# Patient Record
Sex: Female | Born: 1950 | Race: Black or African American | Hispanic: No | Marital: Single | Smoking: Never smoker
Health system: Southern US, Community
[De-identification: ages and names within clinical notes are randomized; demographics above are authoritative.]

## PROBLEM LIST (undated history)

## (undated) HISTORY — PX: TRIGGER FINGER RELEASE: SHX641

---

## 2006-10-10 ENCOUNTER — Emergency Department: Payer: Self-pay | Admitting: Emergency Medicine

## 2007-11-22 ENCOUNTER — Emergency Department: Payer: Self-pay | Admitting: Unknown Physician Specialty

## 2009-10-10 ENCOUNTER — Other Ambulatory Visit: Payer: Self-pay | Admitting: General Practice

## 2010-01-28 ENCOUNTER — Emergency Department: Payer: Self-pay | Admitting: Emergency Medicine

## 2012-12-01 ENCOUNTER — Emergency Department: Payer: Self-pay | Admitting: Emergency Medicine

## 2012-12-01 LAB — BASIC METABOLIC PANEL
Anion Gap: 7 (ref 7–16)
BUN: 14 mg/dL (ref 7–18)
Calcium, Total: 9.1 mg/dL (ref 8.5–10.1)
Chloride: 108 mmol/L — ABNORMAL HIGH (ref 98–107)
Creatinine: 0.87 mg/dL (ref 0.60–1.30)
EGFR (African American): >60
EGFR (Non-African Amer.): >60
Glucose: 97 mg/dL (ref 65–99)
Sodium: 140 mmol/L (ref 136–145)

## 2012-12-01 LAB — CBC
HCT: 40.5 % (ref 35.0–47.0)
HGB: 13.5 g/dL (ref 12.0–16.0)
MCH: 29 pg (ref 26.0–34.0)
MCV: 87 fL (ref 80–100)
Platelet: 228 10*3/uL (ref 150–440)
RBC: 4.64 10*6/uL (ref 3.80–5.20)
RDW: 14.3 % (ref 11.5–14.5)
WBC: 7.6 10*3/uL (ref 3.6–11.0)

## 2012-12-03 ENCOUNTER — Emergency Department: Payer: Self-pay | Admitting: Emergency Medicine

## 2012-12-03 LAB — COMPREHENSIVE METABOLIC PANEL
BUN: 15 mg/dL (ref 7–18)
Calcium, Total: 9.7 mg/dL (ref 8.5–10.1)
Co2: 29 mmol/L (ref 21–32)
Creatinine: 1.09 mg/dL (ref 0.60–1.30)
EGFR (African American): >60
SGOT(AST): 25 U/L (ref 15–37)
SGPT (ALT): 21 U/L (ref 12–78)
Sodium: 135 mmol/L — ABNORMAL LOW (ref 136–145)
Total Protein: 8.7 g/dL — ABNORMAL HIGH (ref 6.4–8.2)

## 2012-12-03 LAB — URINALYSIS, COMPLETE
Bilirubin,UR: NEGATIVE
Glucose,UR: NEGATIVE mg/dL (ref 0–75)
Ketone: NEGATIVE
Ph: 5 (ref 4.5–8.0)
Protein: NEGATIVE
Specific Gravity: 1.006 (ref 1.003–1.030)

## 2012-12-03 LAB — CBC
HCT: 45.6 % (ref 35.0–47.0)
MCH: 29.2 pg (ref 26.0–34.0)
MCV: 87 fL (ref 80–100)
RBC: 5.25 10*6/uL — ABNORMAL HIGH (ref 3.80–5.20)
RDW: 14.4 % (ref 11.5–14.5)

## 2013-11-07 IMAGING — CT CT HEAD WITHOUT CONTRAST
1 of 2 series · 15 of 30 positions shown, 19 images · non-contrast
Comparison: none

REASON FOR EXAM: headache, vomiting
COMMENTS:

PROCEDURE:     CT  - CT HEAD WITHOUT CONTRAST  - December 03, 2012 [DATE]
RESULT:     History: Headache and vomiting.
Comparison Study: Prior CT of 01/28/2010.

[Series 2: soft tissue · axial · 0.42mm/px · z∈[-100,+36]mm · 15 of 31 slices shown, 19 images]
[im 2/31  brain]
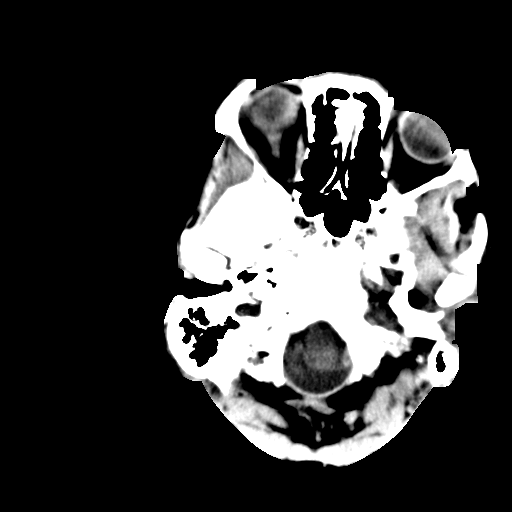
[im 2/31  bone]
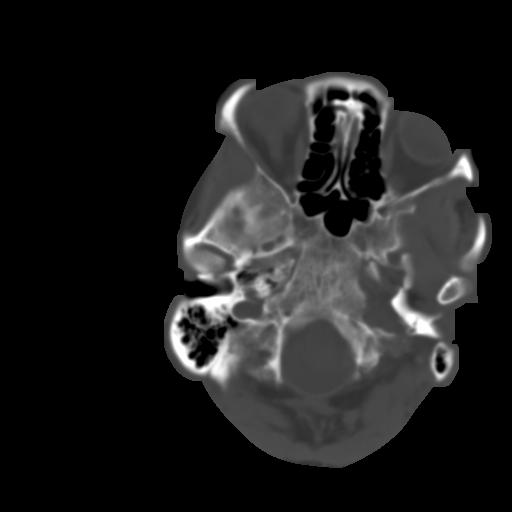
[im 4/31  brain]
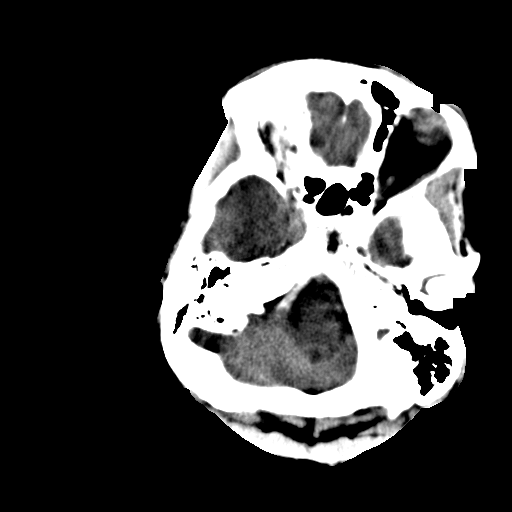
[im 6/31  brain]
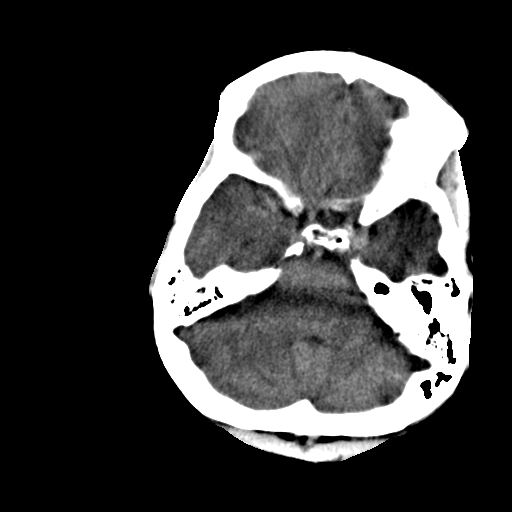
[im 8/31  brain]
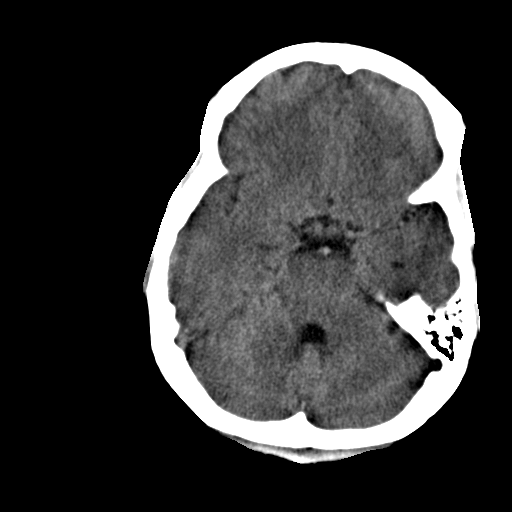
[im 10/31  brain]
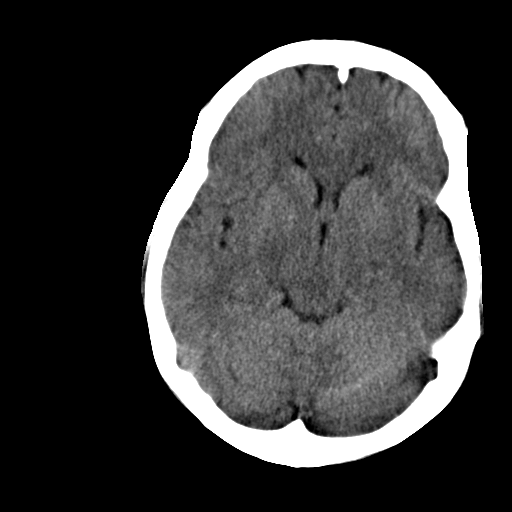
[im 10/31  bone]
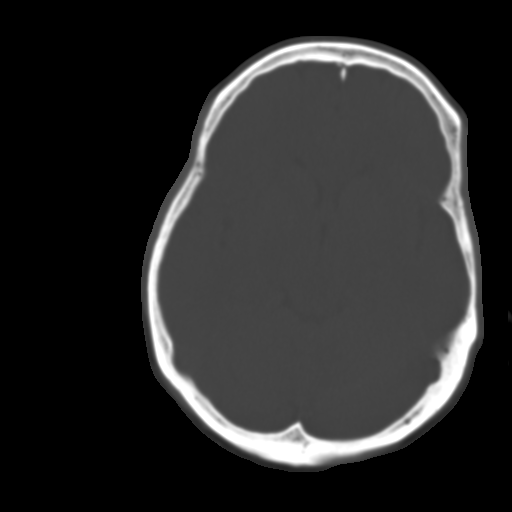
[im 12/31  brain]
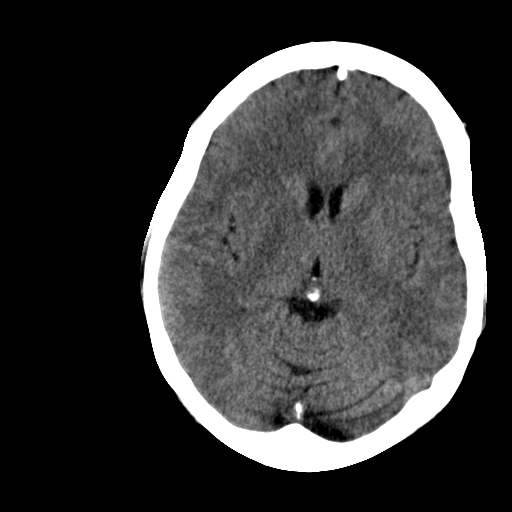
[im 14/31  brain]
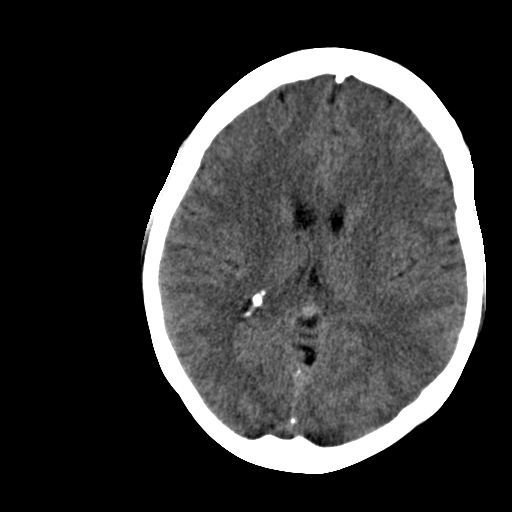
[im 16/31  brain]
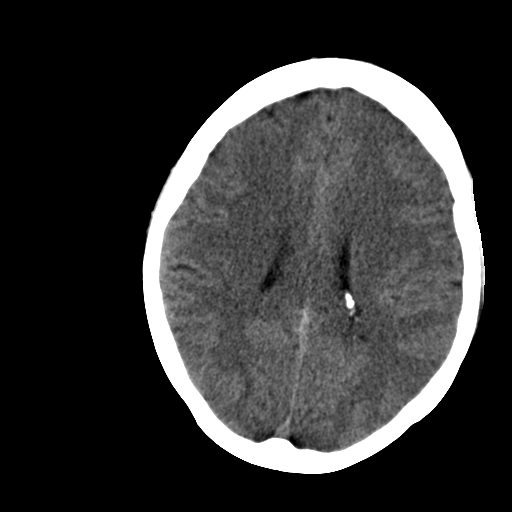
[im 17/31  brain]
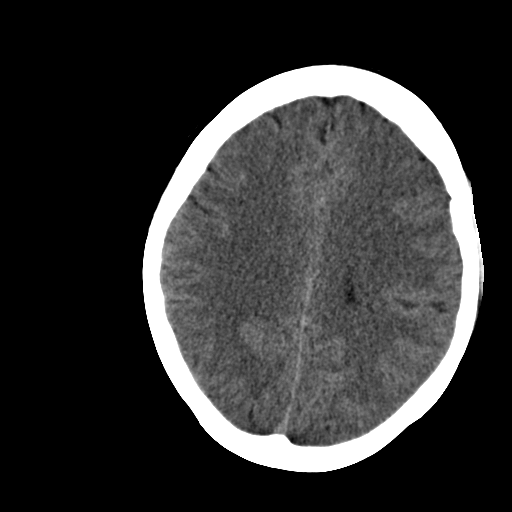
[im 17/31  bone]
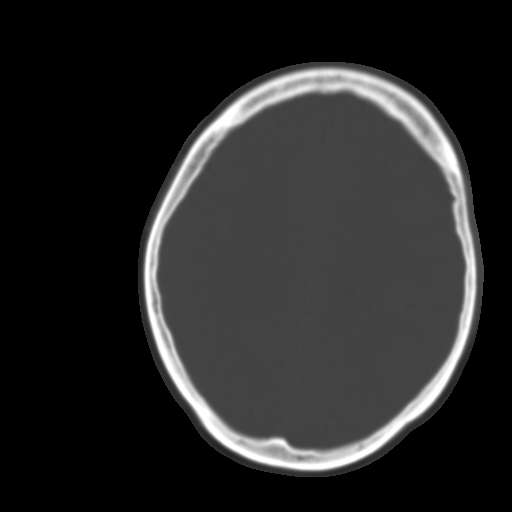
[im 19/31  brain]
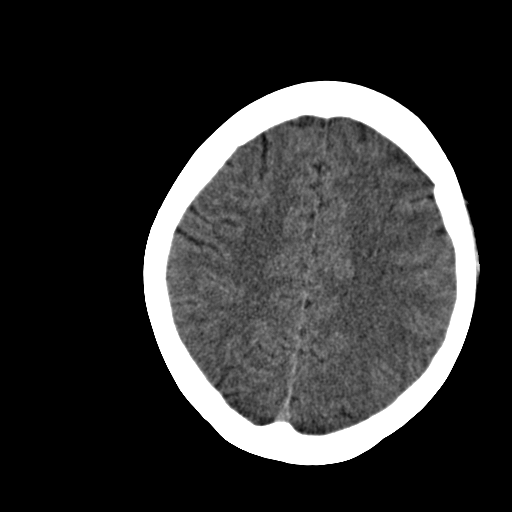
[im 21/31  brain]
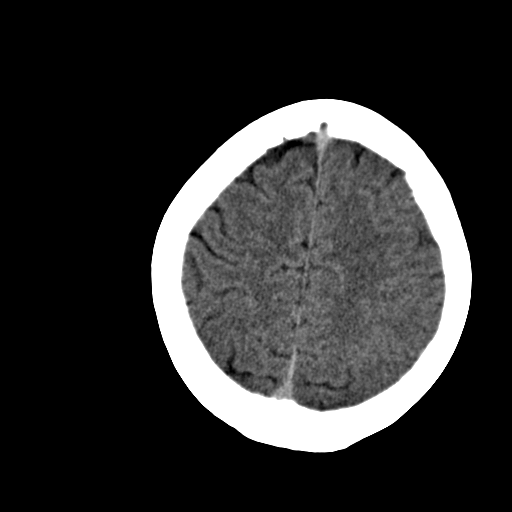
[im 23/31  brain]
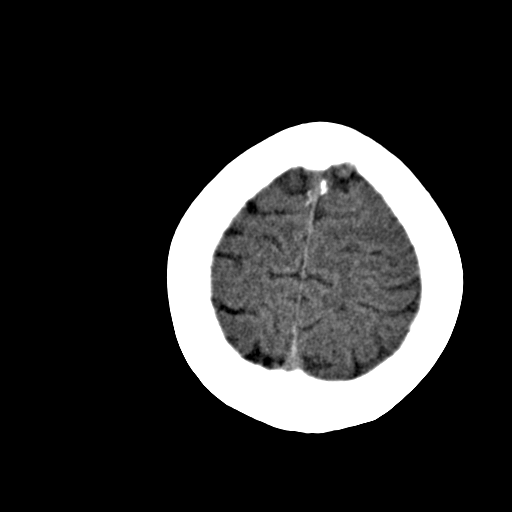
[im 25/31  brain]
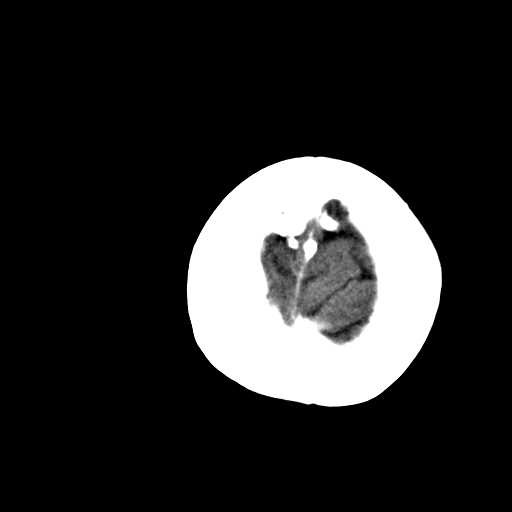
[im 25/31  bone]
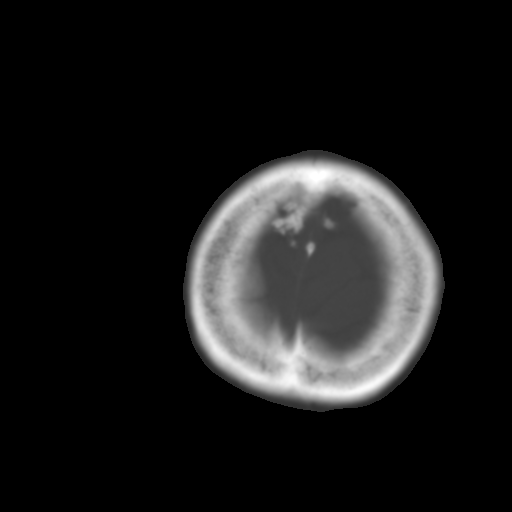
[im 27/31  brain]
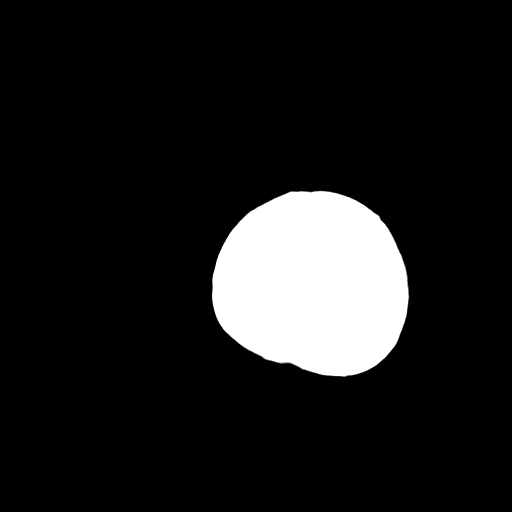
[im 29/31  brain]
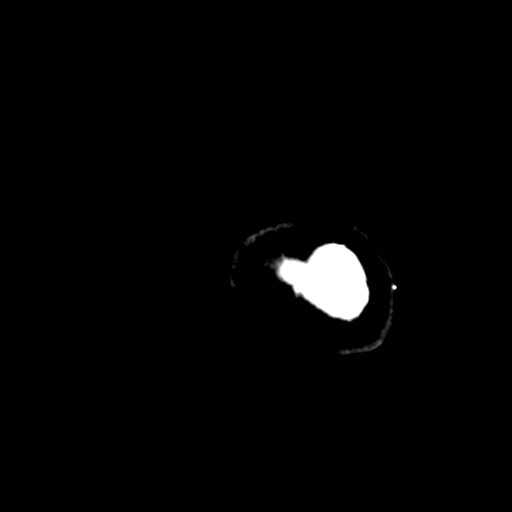

[15 of 30 positions shown; findings below may reference images not displayed]

FINDINGS: Standard exam performed . No mass. No hydrocephalus. No
hemorrhage. No acute bony abnormality.
IMPRESSION: No acute abnormality.

## 2014-02-08 ENCOUNTER — Emergency Department: Payer: Self-pay | Admitting: Emergency Medicine

## 2014-02-08 LAB — COMPREHENSIVE METABOLIC PANEL
ALT: 18 U/L (ref 12–78)
AST: 22 U/L (ref 15–37)
Albumin: 3.9 g/dL (ref 3.4–5.0)
Alkaline Phosphatase: 76 U/L
Anion Gap: 7 (ref 7–16)
BILIRUBIN TOTAL: 0.5 mg/dL (ref 0.2–1.0)
BUN: 6 mg/dL — ABNORMAL LOW (ref 7–18)
CALCIUM: 9 mg/dL (ref 8.5–10.1)
CREATININE: 0.91 mg/dL (ref 0.60–1.30)
Chloride: 106 mmol/L (ref 98–107)
Co2: 28 mmol/L (ref 21–32)
EGFR (African American): >60
EGFR (Non-African Amer.): >60
Glucose: 96 mg/dL (ref 65–99)
OSMOLALITY: 279 (ref 275–301)
Potassium: 3.1 mmol/L — ABNORMAL LOW (ref 3.5–5.1)
SODIUM: 141 mmol/L (ref 136–145)
TOTAL PROTEIN: 8.2 g/dL (ref 6.4–8.2)

## 2014-02-08 LAB — CBC WITH DIFFERENTIAL/PLATELET
Basophil #: 0 10*3/uL (ref 0.0–0.1)
Basophil %: 0.7 %
EOS ABS: 0.2 10*3/uL (ref 0.0–0.7)
Eosinophil %: 3.5 %
HCT: 44.1 % (ref 35.0–47.0)
HGB: 14.6 g/dL (ref 12.0–16.0)
LYMPHS PCT: 20.6 %
Lymphocyte #: 1.3 10*3/uL (ref 1.0–3.6)
MCH: 29.1 pg (ref 26.0–34.0)
MCHC: 33.1 g/dL (ref 32.0–36.0)
MCV: 88 fL (ref 80–100)
Monocyte #: 0.5 x10 3/mm (ref 0.2–0.9)
Monocyte %: 7.9 %
NEUTROS PCT: 67.3 %
Neutrophil #: 4.3 10*3/uL (ref 1.4–6.5)
Platelet: 308 10*3/uL (ref 150–440)
RBC: 5.02 10*6/uL (ref 3.80–5.20)
RDW: 14.1 % (ref 11.5–14.5)
WBC: 6.4 10*3/uL (ref 3.6–11.0)

## 2021-07-30 ENCOUNTER — Emergency Department: Payer: Medicare Other

## 2021-07-30 ENCOUNTER — Encounter: Payer: Self-pay | Admitting: Emergency Medicine

## 2021-07-30 ENCOUNTER — Other Ambulatory Visit: Payer: Self-pay

## 2021-07-30 ENCOUNTER — Emergency Department
Admission: EM | Admit: 2021-07-30 | Discharge: 2021-07-30 | Disposition: A | Discharge status: Home / Self Care | Payer: Medicare Other | Attending: Emergency Medicine | Admitting: Emergency Medicine

## 2021-07-30 DIAGNOSIS — H6501 Acute serous otitis media, right ear: Secondary | ICD-10-CM | POA: Insufficient documentation

## 2021-07-30 DIAGNOSIS — J181 Lobar pneumonia, unspecified organism: Secondary | ICD-10-CM | POA: Insufficient documentation

## 2021-07-30 DIAGNOSIS — J189 Pneumonia, unspecified organism: Secondary | ICD-10-CM

## 2021-07-30 DIAGNOSIS — H6500 Acute serous otitis media, unspecified ear: Secondary | ICD-10-CM

## 2021-07-30 DIAGNOSIS — H9201 Otalgia, right ear: Secondary | ICD-10-CM | POA: Diagnosis present

## 2021-07-30 DIAGNOSIS — Z20822 Contact with and (suspected) exposure to covid-19: Secondary | ICD-10-CM | POA: Diagnosis not present

## 2021-07-30 LAB — RESP PANEL BY RT-PCR (FLU A&B, COVID) ARPGX2
Influenza A by PCR: NEGATIVE
Influenza B by PCR: NEGATIVE
SARS Coronavirus 2 by RT PCR: NEGATIVE

## 2021-07-30 MED ORDER — AZITHROMYCIN 250 MG PO TABS: ORAL_TABLET | ORAL | 0 refills | Status: DC

## 2021-07-30 MED ORDER — AMOXICILLIN-POT CLAVULANATE 875-125 MG PO TABS
1.0000 | ORAL_TABLET | Freq: Once | ORAL | Status: AC
  Administered 2021-07-30: 11:00:00 1 via ORAL
  Filled 2021-07-30: qty 1

## 2021-07-30 MED ORDER — OXYCODONE-ACETAMINOPHEN 5-325 MG PO TABS
1.0000 | ORAL_TABLET | Freq: Four times a day (QID) | ORAL | 0 refills | Status: DC | PRN
Start: 2021-07-30 — End: 2021-09-19

## 2021-07-30 MED ORDER — AMOXICILLIN 875 MG PO TABS: 875.0000 mg | ORAL_TABLET | Freq: Two times a day (BID) | ORAL | 0 refills | Status: DC

## 2021-07-30 MED ORDER — OXYCODONE-ACETAMINOPHEN 5-325 MG PO TABS
1.0000 | ORAL_TABLET | Freq: Once | ORAL | Status: AC
Start: 2021-07-30 — End: 2021-07-30
  Administered 2021-07-30: 11:00:00 1 via ORAL
  Filled 2021-07-30: qty 1

## 2021-07-30 NOTE — Discharge Instructions (Signed)
Follow-up with your regular doctor if not improving in 3 to 4 days.  Return emergency department if worsening.  Take medications as prescribed.  Use over-the-counter Mucinex and Delsym for your cough.  With pneumonia you do not want to suppress the cough so much that you do not cough at all as this is how your lungs clear the infection.

## 2021-07-30 NOTE — ED Provider Notes (Signed)
[facility] Emergency Department Provider Note  ____________________________________________   Event Date/Time   First MD Initiated Contact with Patient 07/30/21 1007     (approximate)  I have reviewed the triage vital signs and the nursing notes.   HISTORY  Chief Complaint Otalgia and Cough    HPI Elizabeth Cruz is a 70 y.o. female presents emergency department complaining of right ear pain along with a cough for 4 to 5 days.  Patient states she has not had a fever but does have productive cough.  Ear pain has gotten severe overnight.  No drainage from the ear.  No decrease in hearing.  No past medical history on file.  There are no problems to display for this patient.     Prior to Admission medications   Medication Sig Start Date End Date Taking? Authorizing Provider  amoxicillin (AMOXIL) 875 MG tablet Take 1 tablet (875 mg total) by mouth 2 (two) times daily. 07/30/21  Yes Shishir Krantz, Roselyn Bering, PA-C  azithromycin (ZITHROMAX Z-PAK) 250 MG tablet 2 pills today then 1 pill a day for 4 days 07/30/21  Yes Neilani Duffee, Roselyn Bering, PA-C  oxyCODONE-acetaminophen (PERCOCET) 5-325 MG tablet Take 1 tablet by mouth every 6 (six) hours as needed for up to 365 doses for severe pain. 07/30/21  Yes Faythe Ghee, PA-C    Allergies Patient has no known allergies.  No family history on file.  Social History Social History   Tobacco Use   Smoking status: Never   Smokeless tobacco: Never  Substance Use Topics   Alcohol use: Never    Review of Systems  Constitutional: No fever/chills Eyes: No visual changes. ENT: No sore throat. Respiratory: Positive cough Cardiovascular: Denies chest pain Gastrointestinal: Denies abdominal pain Genitourinary: Negative for dysuria. Musculoskeletal: Negative for back pain. Skin: Negative for rash. Psychiatric: no mood changes,     ____________________________________________   PHYSICAL EXAM:  VITAL SIGNS: ED Triage  Vitals  Enc Vitals Group     BP 07/30/21 0936 (!) 187/72     Pulse Rate 07/30/21 0936 (!) 101     Resp 07/30/21 0936 20     Temp 07/30/21 0936 98.7 F (37.1 C)     Temp Source 07/30/21 0936 Oral     SpO2 07/30/21 0936 94 %     Weight 07/30/21 0937 128 lb (58.1 kg)     Height 07/30/21 0937 5\' 9"  (1.753 m)     Head Circumference --      Peak Flow --      Pain Score 07/30/21 0937 5     Pain Loc --      Pain Edu? --      Excl. in [place]? --     Constitutional: Alert and oriented. Well appearing and in no acute distress. Eyes: Conjunctivae are normal.  Head: Atraumatic. Ears: Right TM is bright red and swollen Nose: No congestion/rhinnorhea. Mouth/Throat: Mucous membranes are moist.   Neck:  supple no lymphadenopathy noted Cardiovascular: Normal rate, regular rhythm. Heart sounds are normal Respiratory: Normal respiratory effort.  No retractions, lungs c t a, cough is congested GU: deferred Musculoskeletal: FROM all extremities, warm and well perfused Neurologic:  Normal speech and language.  Skin:  Skin is warm, dry and intact. No rash noted. Psychiatric: Mood and affect are normal. Speech and behavior are normal.  ____________________________________________   LABS (all labs ordered are listed, but only abnormal results are displayed)  Labs Reviewed  RESP PANEL BY RT-PCR (FLU A&B, COVID)  ARPGX2   ____________________________________________   ____________________________________________  RADIOLOGY  Chest x-ray  ____________________________________________   PROCEDURES  Procedure(s) performed: No  Procedures    ____________________________________________   INITIAL IMPRESSION / ASSESSMENT AND PLAN / ED COURSE  Pertinent labs & imaging results that were available during my care of the patient were reviewed by me and considered in my medical decision making (see chart for details).   The patient is a 70 year old female presents emergency department with ear  pain and URI symptoms.  See HPI.  Physical exam shows the patient to appear stable.    Respiratory panel was negative for COVID or pneumonia due to her age did a chest x-ray which is reviewed by me and confirmed by radiology stating possible left lower lobe pneumonia.  That combined with the red TM will warrant treatment with antibiotics.    Patient was given a dose of Augmentin here in the ED.  She will get a prescription for Zithromax and amoxicillin.  She is to follow-up with her regular doctor if not improving in 3 days.  Return the emergency department if worsening.  Patient is in agreement treatment plan.  She was discharged stable condition.     Elizabeth Cruz was evaluated in Emergency Department on 07/30/2021 for the symptoms described in the history of present illness. She was evaluated in the context of the global COVID-19 pandemic, which necessitated consideration that the patient might be at risk for infection with the SARS-CoV-2 virus that causes COVID-19. Institutional protocols and algorithms that pertain to the evaluation of patients at risk for COVID-19 are in a state of rapid change based on information released by regulatory bodies including the CDC and federal and state organizations. These policies and algorithms were followed during the patient's care in the ED.    As part of my medical decision making, I reviewed the following data within the electronic MEDICAL RECORD NUMBER History obtained from family, Nursing notes reviewed and incorporated, Labs reviewed , Old chart reviewed, Radiograph reviewed , Notes from prior ED visits, and [place] Controlled Substance Database  ____________________________________________   FINAL CLINICAL IMPRESSION(S) / ED DIAGNOSES  Final diagnoses:  Acute serous otitis media, recurrence not specified, unspecified laterality  Community acquired pneumonia of left lower lobe of lung      NEW MEDICATIONS STARTED DURING THIS VISIT:  New Prescriptions    AMOXICILLIN (AMOXIL) 875 MG TABLET    Take 1 tablet (875 mg total) by mouth 2 (two) times daily.   AZITHROMYCIN (ZITHROMAX Z-PAK) 250 MG TABLET    2 pills today then 1 pill a day for 4 days   OXYCODONE-ACETAMINOPHEN (PERCOCET) 5-325 MG TABLET    Take 1 tablet by mouth every 6 (six) hours as needed for up to 365 doses for severe pain.     Note:  This document was prepared using Dragon voice recognition software and may include unintentional dictation errors.    Faythe Ghee, PA-C 07/30/21 1040    Minna Antis, MD 07/30/21 [phone]

## 2021-07-30 NOTE — ED Triage Notes (Signed)
Pt via POV from home. Pt c/o R ear pain and cough for the past week. Pt states she has been tested for COVID but it was negative. Denies CP/SOB. Denies fevers. Pt is A&OX4 and NAD.

## 2021-09-19 ENCOUNTER — Other Ambulatory Visit: Payer: Self-pay

## 2021-09-19 ENCOUNTER — Emergency Department
Admission: EM | Admit: 2021-09-19 | Discharge: 2021-09-19 | Disposition: A | Discharge status: Home / Self Care | Payer: Medicare Other | Attending: Emergency Medicine | Admitting: Emergency Medicine

## 2021-09-19 DIAGNOSIS — M79632 Pain in left forearm: Secondary | ICD-10-CM | POA: Diagnosis present

## 2021-09-19 DIAGNOSIS — Y92410 Unspecified street and highway as the place of occurrence of the external cause: Secondary | ICD-10-CM | POA: Insufficient documentation

## 2021-09-19 DIAGNOSIS — M7918 Myalgia, other site: Secondary | ICD-10-CM

## 2021-09-19 MED ORDER — CYCLOBENZAPRINE HCL 5 MG PO TABS: 5.0000 mg | ORAL_TABLET | Freq: Three times a day (TID) | ORAL | 0 refills | Status: AC | PRN
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
## 2021-09-19 NOTE — Discharge Instructions (Signed)
Your exam is consistent with muscle strain to the upper arm but into the coccydynia.  No sign of a serious injury.  Take the prescription muscle relaxant as needed.  May take over-the-counter Tylenol or Motrin as needed for pain and inflammation relief.  You may apply ice and/or moist heat any sore muscles.  Follow-up with your primary provider or local urgent care for ongoing symptoms.
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
## 2022-07-04 IMAGING — CR DG CHEST 2V
1 series · 2 of 2 positions shown · non-contrast
Comparison: None.

CLINICAL DATA: Cough

EXAM:
CHEST - 2 VIEW

[Series 1: dg chest 2 view · 0.14mm/px · 2 of 2 slices shown]
[im 1/2]
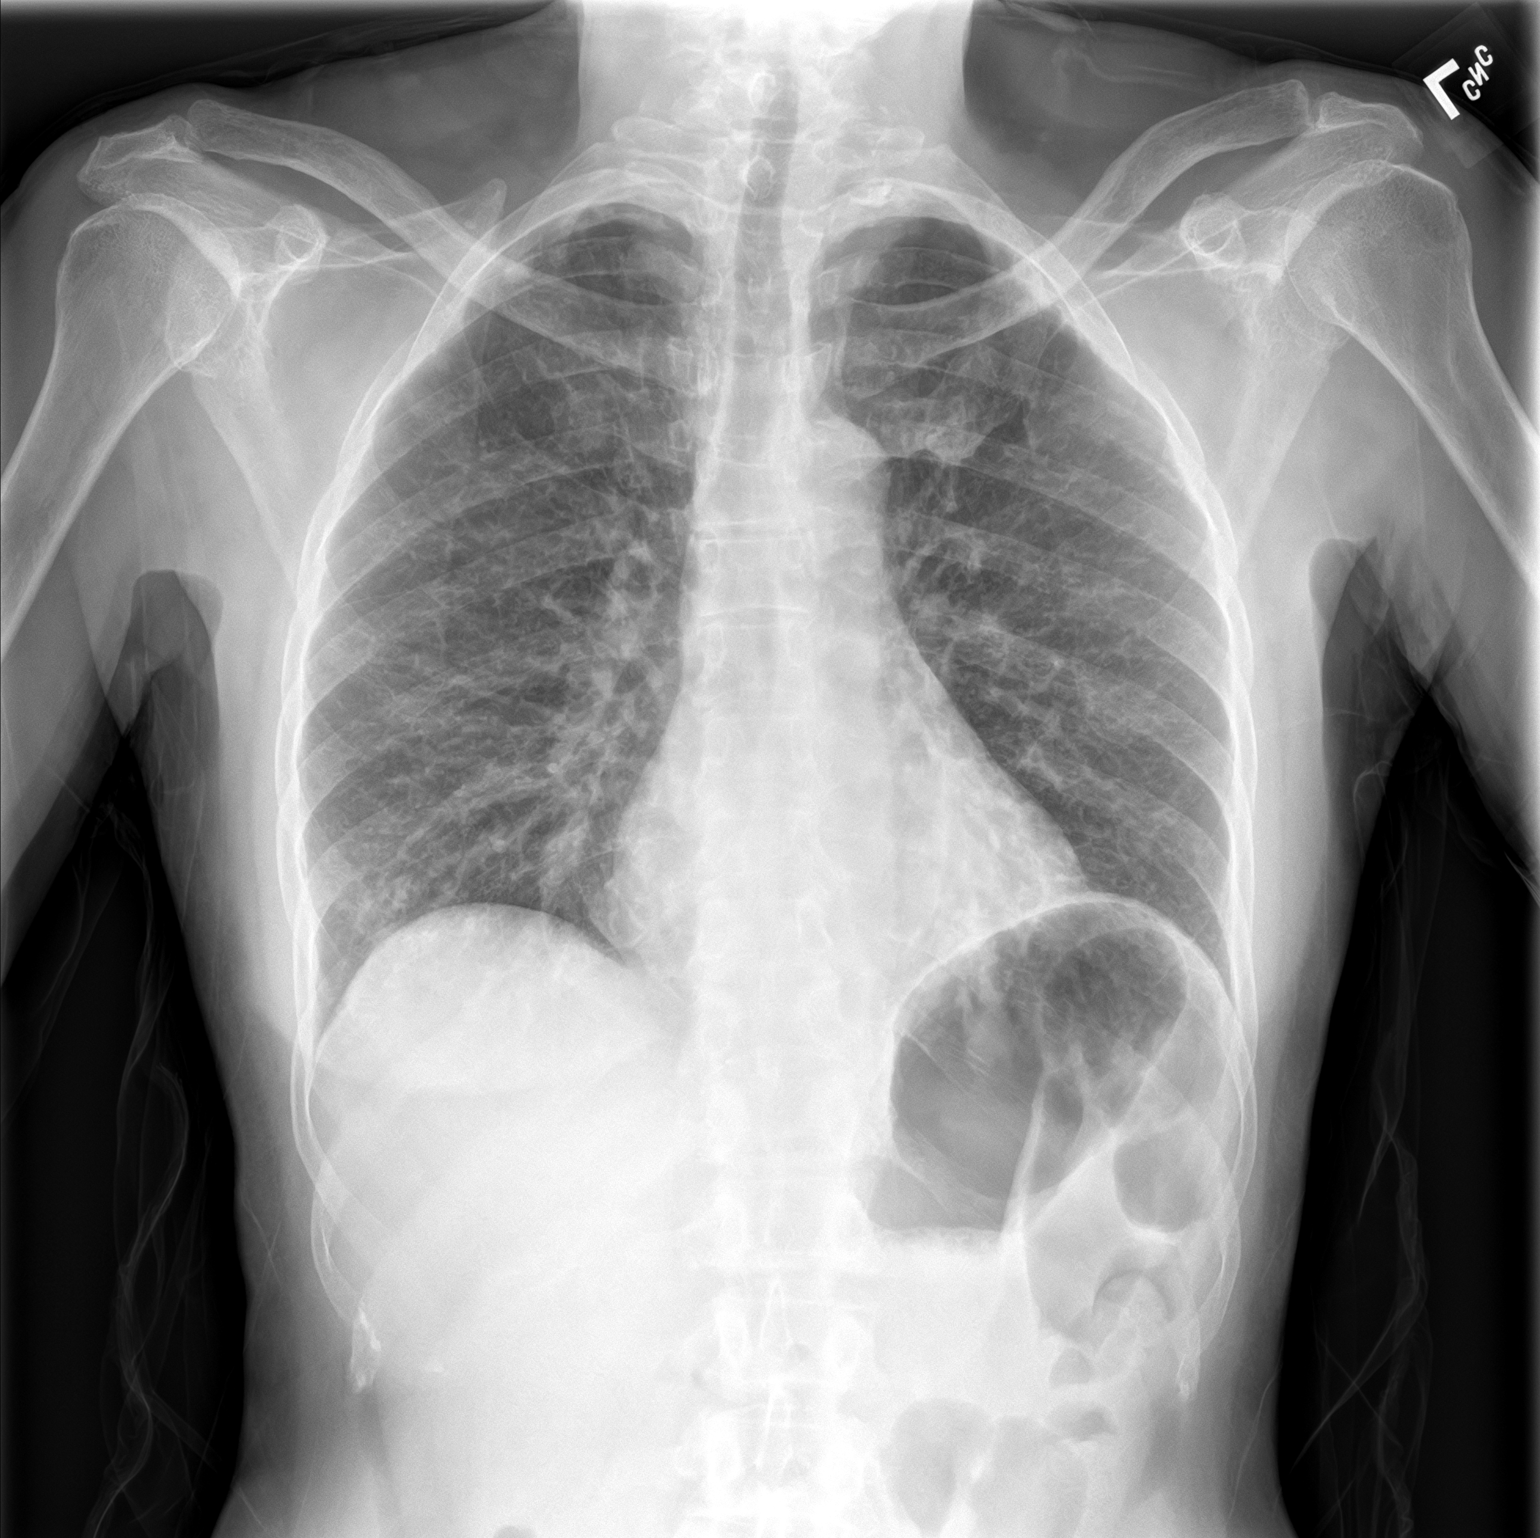
[im 2/2]
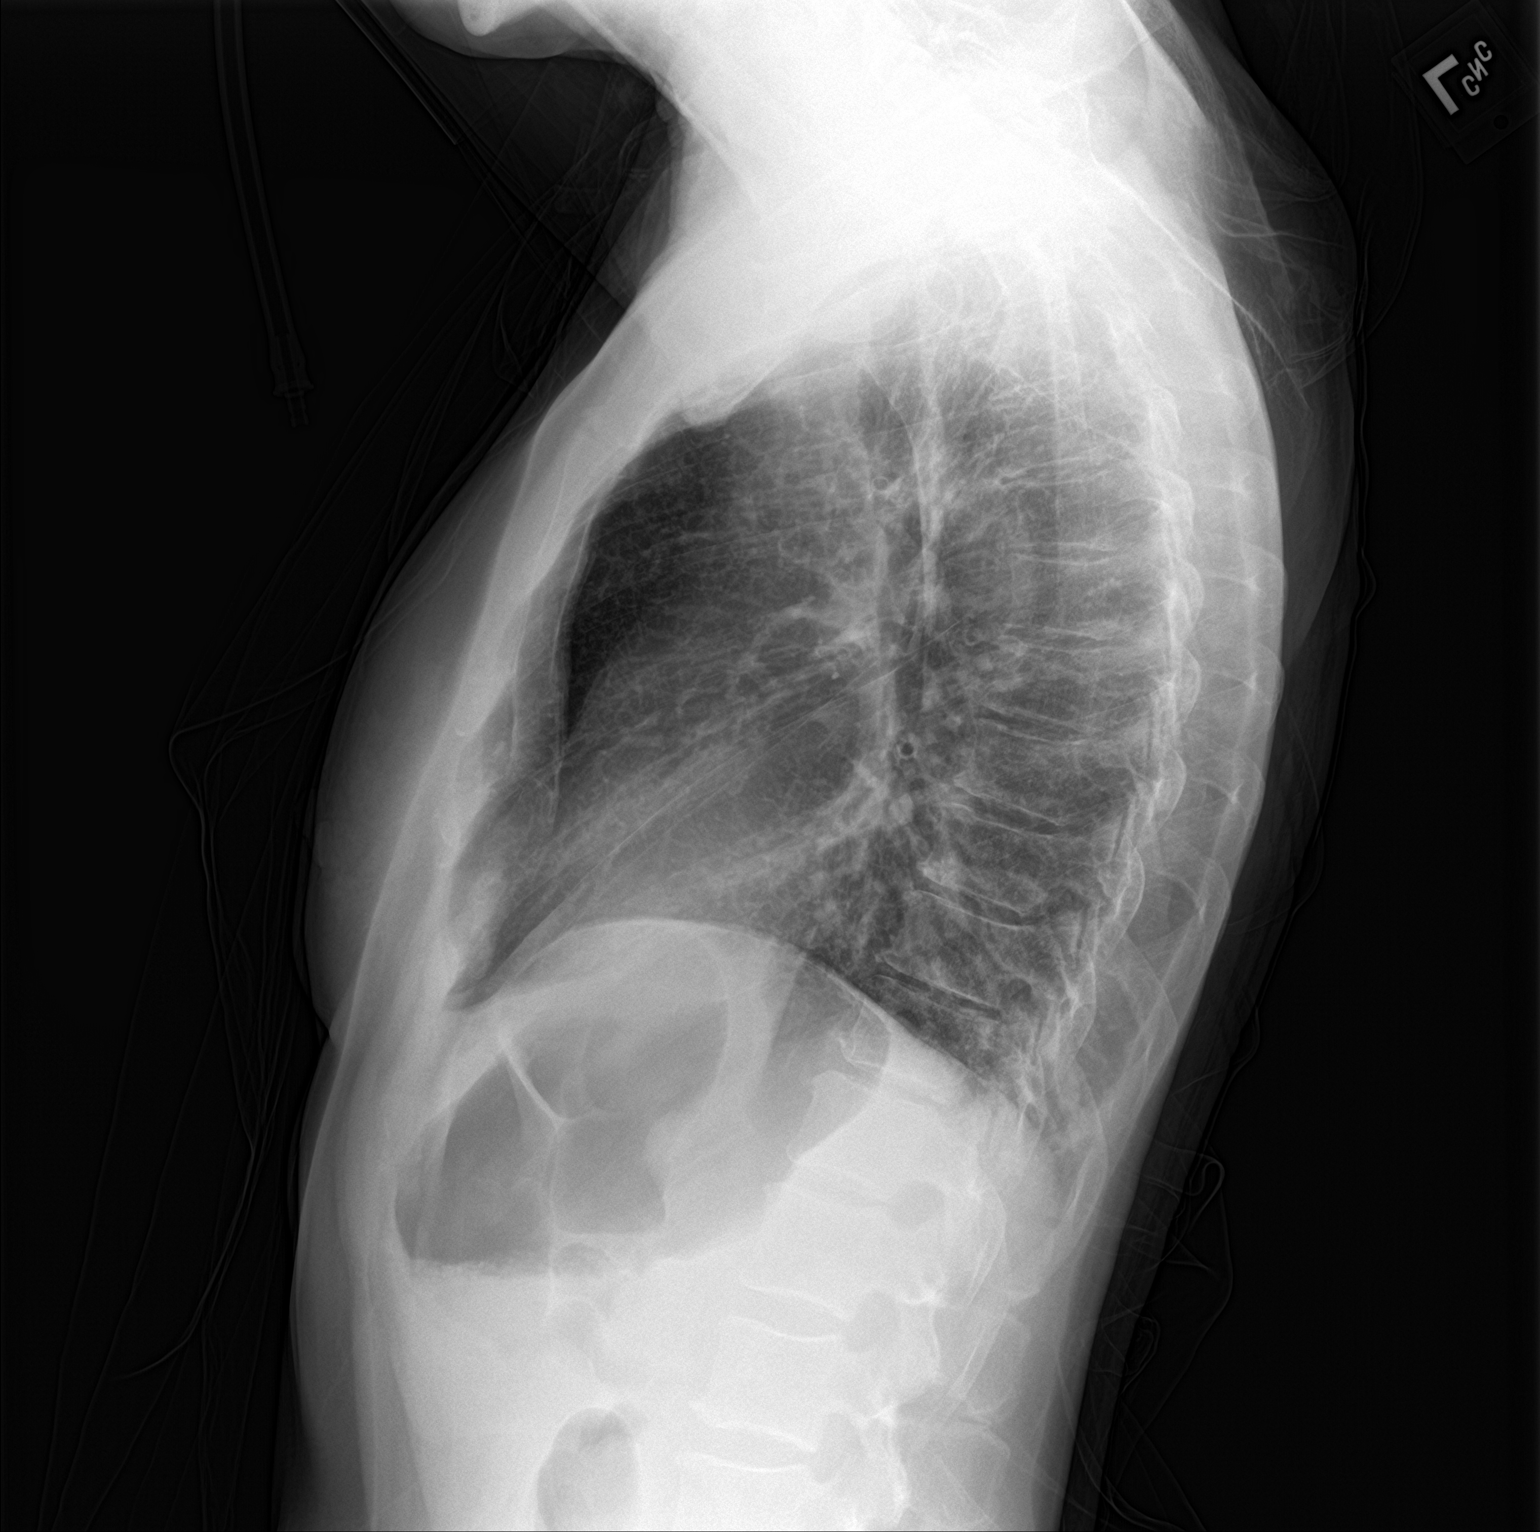

[2 of 2 positions shown; findings below may reference images not displayed]

FINDINGS: Cardiac size is within normal limits. There are no signs of
pulmonary edema. There is prominence of interstitial markings in the
parahilar regions and lower lung fields. There is crowding of
markings in the medial left lower lung fields. There is no focal
pulmonary consolidation. Costophrenic angles are clear. There is no
pneumothorax.
IMPRESSION: There are no signs of alveolar pulmonary edema or focal pulmonary
consolidation. Increased interstitial markings are seen in the
parahilar regions and lower lung fields suggesting bronchitis.
Crowding of markings in the medial left lower lung fields may
suggest small focus of atelectasis/pneumonia in the left lower lobe.
